# Patient Record
Sex: Male | Born: 2000 | Race: White | Hispanic: No | Marital: Single | Smoking: Never smoker
Health system: Southern US, Community
[De-identification: ages and names within clinical notes are randomized; demographics above are authoritative.]

## PROBLEM LIST (undated history)

## (undated) DIAGNOSIS — R030 Elevated blood-pressure reading, without diagnosis of hypertension: Secondary | ICD-10-CM

## (undated) HISTORY — DX: Elevated blood-pressure reading, without diagnosis of hypertension: R03.0

---
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
## 2022-06-03 DIAGNOSIS — R21 Rash and other nonspecific skin eruption: Secondary | ICD-10-CM | POA: Diagnosis not present

## 2022-06-03 DIAGNOSIS — H60331 Swimmer's ear, right ear: Secondary | ICD-10-CM | POA: Diagnosis not present

## 2022-07-30 IMAGING — DX DG CERVICAL SPINE COMPLETE 4+V
5 series · 5 of 5 positions shown · non-contrast
Comparison: None.

CLINICAL DATA: Neck pain

EXAM:
CERVICAL SPINE - COMPLETE 4+ VIEW

[c-spine lat]
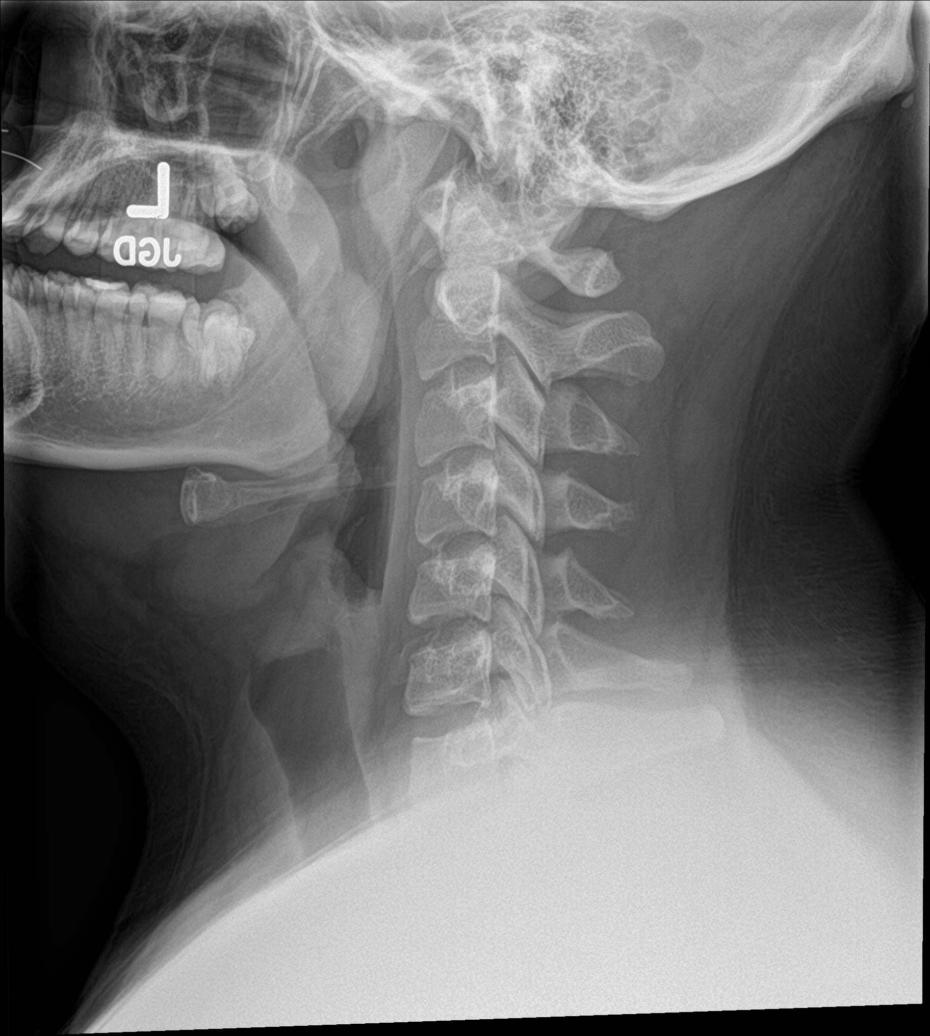

[c-spine obl (1 of 2)]
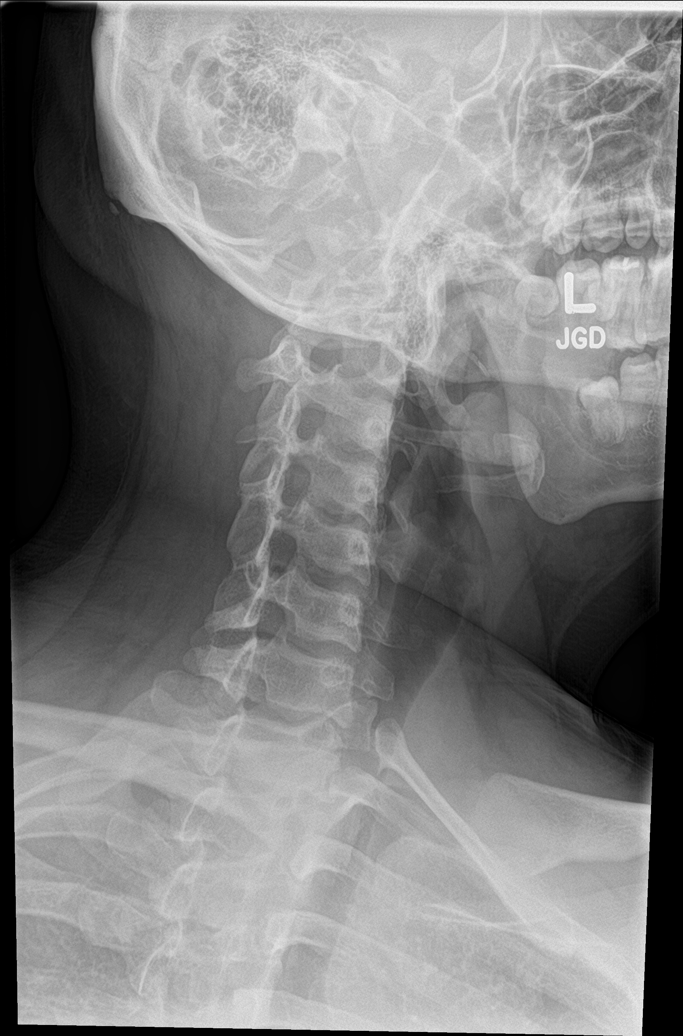

[c-spine obl (2 of 2)]
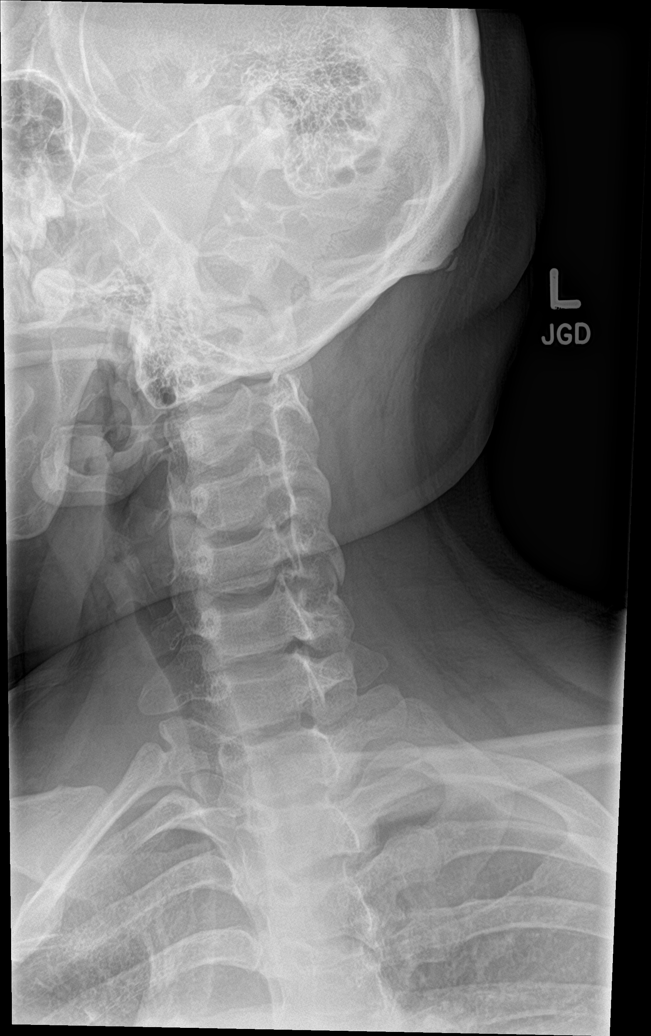

[c-spine ap]
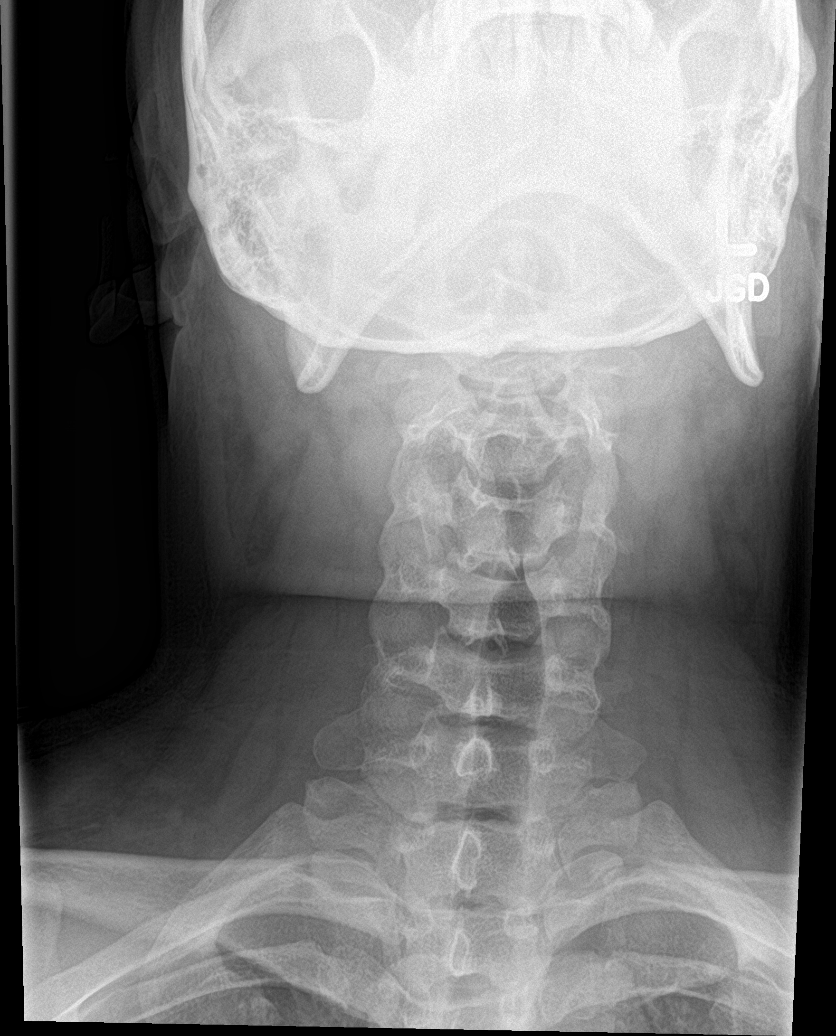

[c-spine open mouth]
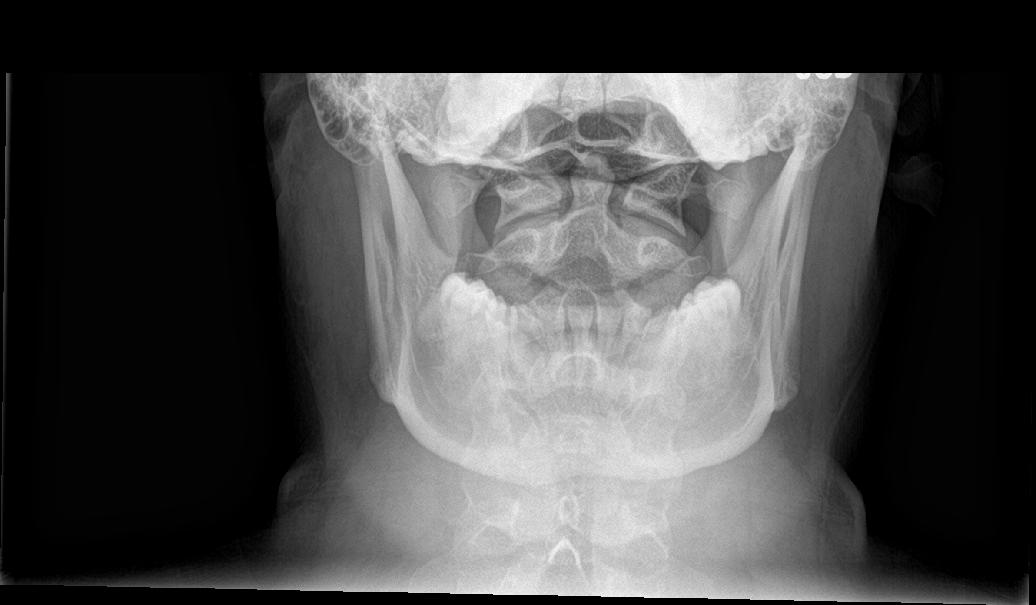

[5 of 5 positions shown; findings below may reference images not displayed]

FINDINGS: No prevertebral soft tissue swelling. Normal alignment of the
vertebral bodies. Normal spinal laminal line. Oblique projections
demonstrate no traumatic narrowing of the neural foramina. The LEFT
neural foraminal oblique view is suboptimal due to rotation. Open
mouth odontoid view demonstrates normal alignment of the lateral
masses of C1 on C2.
IMPRESSION: No explanation for neck pain. No significant arthropathy. No acute
findings

## 2022-11-20 ENCOUNTER — Other Ambulatory Visit: Payer: Self-pay

## 2022-11-20 ENCOUNTER — Emergency Department (HOSPITAL_COMMUNITY)
Admission: EM | Admit: 2022-11-20 | Discharge: 2022-11-20 | Disposition: A | Discharge status: Home / Self Care | Payer: Medicaid Other | Attending: Emergency Medicine | Admitting: Emergency Medicine

## 2022-11-20 ENCOUNTER — Encounter (HOSPITAL_COMMUNITY): Payer: Self-pay

## 2022-11-20 ENCOUNTER — Emergency Department (HOSPITAL_COMMUNITY): Payer: Medicaid Other

## 2022-11-20 DIAGNOSIS — R0602 Shortness of breath: Secondary | ICD-10-CM | POA: Insufficient documentation

## 2022-11-20 DIAGNOSIS — R0600 Dyspnea, unspecified: Secondary | ICD-10-CM | POA: Diagnosis not present

## 2022-11-20 DIAGNOSIS — R079 Chest pain, unspecified: Secondary | ICD-10-CM

## 2022-11-20 DIAGNOSIS — R0789 Other chest pain: Secondary | ICD-10-CM | POA: Diagnosis not present

## 2022-11-20 DIAGNOSIS — Z20822 Contact with and (suspected) exposure to covid-19: Secondary | ICD-10-CM | POA: Insufficient documentation

## 2022-11-20 DIAGNOSIS — R0781 Pleurodynia: Secondary | ICD-10-CM | POA: Diagnosis not present

## 2022-11-20 LAB — BASIC METABOLIC PANEL
Anion gap: 15 (ref 5–15)
BUN: 13 mg/dL (ref 6–20)
CO2: 23 mmol/L (ref 22–32)
Calcium: 9.5 mg/dL (ref 8.9–10.3)
Chloride: 102 mmol/L (ref 98–111)
Creatinine, Ser: 0.81 mg/dL (ref 0.61–1.24)
GFR, Estimated: >60 mL/min (ref >60)
Glucose, Bld: 108 mg/dL — ABNORMAL HIGH (ref 70–99)
Potassium: 3.6 mmol/L (ref 3.5–5.1)
Sodium: 140 mmol/L (ref 135–145)

## 2022-11-20 LAB — RESP PANEL BY RT-PCR (RSV, FLU A&B, COVID)  RVPGX2
Influenza A by PCR: NEGATIVE
Influenza B by PCR: NEGATIVE
Resp Syncytial Virus by PCR: NEGATIVE
SARS Coronavirus 2 by RT PCR: NEGATIVE

## 2022-11-20 LAB — CBC
HCT: 43.8 % (ref 39.0–52.0)
Hemoglobin: 15.1 g/dL (ref 13.0–17.0)
MCH: 28.6 pg (ref 26.0–34.0)
MCHC: 34.5 g/dL (ref 30.0–36.0)
MCV: 83 fL (ref 80.0–100.0)
Platelets: 236 10*3/uL (ref 150–400)
RBC: 5.28 MIL/uL (ref 4.22–5.81)
RDW: 13.7 % (ref 11.5–15.5)
WBC: 13.2 10*3/uL — ABNORMAL HIGH (ref 4.0–10.5)
nRBC: 0 % (ref 0.0–0.2)

## 2022-11-20 LAB — TROPONIN I (HIGH SENSITIVITY): Troponin I (High Sensitivity): 3 ng/L (ref <18)

## 2022-11-20 LAB — D-DIMER, QUANTITATIVE: D-Dimer, Quant: <0.27 ug{FEU}/mL (ref 0.00–0.50)

## 2022-11-20 LAB — BRAIN NATRIURETIC PEPTIDE: B Natriuretic Peptide: 6.9 pg/mL (ref 0.0–100.0)

## 2022-11-20 MED ORDER — IBUPROFEN 800 MG PO TABS
800.0000 mg | ORAL_TABLET | Freq: Once | ORAL | Status: AC
  Administered 2022-11-20: 800 mg via ORAL
  Filled 2022-11-20: qty 1

## 2022-11-20 NOTE — ED Provider Triage Note (Signed)
Emergency Medicine Provider Triage Evaluation Note  Alvin Hood , a 22 y.o. male  was evaluated in triage.  Pt complains of right sided rib pain pain and dyspnea since yesterday.  No fall or injury to the chest.  No history of asthma, COPD, lung disease.  No fever, cough, congestion.  Describes right sided rib pain as pleuritic but severe.  No history of the symptoms.  No recent surgery, long distance travel, hormone use, history of DVT/PE.  No leg pain or swelling.  Review of Systems  Positive: See HPI Negative: See HPI  Physical Exam  BP (!) 164/104   Pulse 92   Temp 98.6 F (37 C)   Resp 20   SpO2 100%  Gen:   Awake, mildly dyspneic at rest Resp:  Slight tachypnea, diminished breath sounds throughout MSK:   Moves extremities without difficulty no lower extremity edema Other:  Regular rate and rhythm, abdomen soft and nontender, no tenderness over the ribs  Medical Decision Making  Medically screening exam initiated at 7:08 PM.  Appropriate orders placed.  Jedrick Broden was informed that the remainder of the evaluation will be completed by another provider, this initial triage assessment does not replace that evaluation, and the importance of remaining in the ED until their evaluation is complete.     Richardson Dopp 11/20/22 1909

## 2022-11-20 NOTE — ED Triage Notes (Signed)
Patient arrives for eval of stabbing R sided rib cage pain and sob that started yesterday around lunchtime. SOB worse when lying flat or breathing deeply. Denies injury.

## 2022-11-20 NOTE — ED Provider Notes (Signed)
[place] EMERGENCY DEPARTMENT AT [facility] Provider Note   CSN: [id number] Arrival date & time: 11/20/22  1843     History  Chief Complaint  Patient presents with   Shortness of Breath   HPI Alvin Hood is a 22 y.o. male presenting for chest pain shortness of breath.  Started all of a sudden yesterday around lunchtime.  Chest pain is in the right side of his chest and feels like it is in the "rib cage.  Chest pain and shortness of breath started at the same time.  Shortness of breath is worse with lying flat and deep breathing.  Denies cough and fever.  Denies lower extremity edema.  States as long as he is not moving the chest pain and shortness of breath subsides.   Shortness of Breath      Home Medications Prior to Admission medications   Medication Sig Start Date End Date Taking? Authorizing Provider  EPIDUO FORTE 0.3-2.5 % GEL Thin topical layer 1-2 times a day 05/01/21   Theadore Nan, MD      Allergies    Patient has no known allergies.    Review of Systems   Review of Systems  Respiratory:  Positive for shortness of breath.     Physical Exam   Vitals:   11/20/22 2130 11/20/22 2145  BP: (!) 151/93 (!) 147/86  Pulse: 91 90  Resp: 20 16  Temp:    SpO2: 100% 95%    CONSTITUTIONAL:  well-appearing, NAD NEURO:  Alert and oriented x 3, CN 3-12 grossly intact EYES:  eyes equal and reactive ENT/NECK:  Supple, no stridor  CARDIO:  regular rate and rhythm, appears well-perfused  PULM:  No respiratory distress, CTAB, no wheezing or rales GI/GU:  non-distended, soft MSK/SPINE:  No gross deformities, no edema, moves all extremities  SKIN:  no rash, atraumatic  *Additional and/or pertinent findings included in MDM below  ED Results / Procedures / Treatments   Labs (all labs ordered are listed, but only abnormal results are displayed) Labs Reviewed  BASIC METABOLIC PANEL - Abnormal; Notable for the following components:       Result Value   Glucose, Bld 108 (*)    All other components within normal limits  CBC - Abnormal; Notable for the following components:   WBC 13.2 (*)    All other components within normal limits  RESP PANEL BY RT-PCR (RSV, FLU A&B, COVID)  RVPGX2  D-DIMER, QUANTITATIVE  BRAIN NATRIURETIC PEPTIDE  TROPONIN I (HIGH SENSITIVITY)    EKG None  Radiology DG Chest 2 View  Result Date: 11/20/2022 CLINICAL DATA:  Dyspnea and right-sided rib pain. EXAM: CHEST - 2 VIEW COMPARISON:  March 06, 2013 FINDINGS: The heart size and mediastinal contours are within normal limits. Both lungs are clear. The visualized skeletal structures are unremarkable. IMPRESSION: No active cardiopulmonary disease. Electronically Signed   By: Aram Candela M.D.   On: 11/20/2022 20:10    Procedures Procedures    Medications Ordered in ED Medications  ibuprofen (ADVIL) tablet 800 mg (has no administration in time range)    ED Course/ Medical Decision Making/ A&P                                 Medical Decision Making Amount and/or Complexity of Data Reviewed Labs: ordered.   22 yo well-appearing male presenting for shortness of breath and chest pain.  Exam was  unremarkable.  DDx includes ACS, PE, pneumothorax, rib fracture, pneumonia, precordial catch or costochondritis, and renal pathology.  Overall looks clinically well, no acute distress and hemodynamically stable.  Workup was overall reassuring.  On reassessment, patient stated that he no longer had chest pain or shortness of breath.  Doubt ACS given lack of risk factors and symptoms and reassuring EKG and troponin.  Doubt PE given PERC negative and negative D-dimer.  X-ray was unremarkable making pneumothorax, pneumonia or rib fracture unlikely.  Suspect costochondritis versus precordial catch.  Treated with ibuprofen.  Advised to follow-up with his PCP.  Vital stable.  Discussed return precautions.  Discharged.        Final Clinical  Impression(s) / ED Diagnoses Final diagnoses:  Chest pain, unspecified type    Rx / DC Orders ED Discharge Orders     None         Gareth Eagle, PA-C 11/20/22 2218    Edwin Dada P, DO 11/25/22 [phone]

## 2022-11-20 NOTE — Discharge Instructions (Addendum)
Evaluation today was overall reassuring.  Recommend you follow-up with your PCP.  If your symptoms return please return emergency department further evaluation.

## 2023-06-04 ENCOUNTER — Other Ambulatory Visit: Payer: Self-pay

## 2023-06-04 ENCOUNTER — Emergency Department (HOSPITAL_COMMUNITY)
Admission: EM | Admit: 2023-06-04 | Discharge: 2023-06-04 | Disposition: A | Discharge status: Home / Self Care | Attending: Emergency Medicine | Admitting: Emergency Medicine

## 2023-06-04 ENCOUNTER — Emergency Department (HOSPITAL_COMMUNITY)

## 2023-06-04 ENCOUNTER — Encounter (HOSPITAL_COMMUNITY): Payer: Self-pay

## 2023-06-04 DIAGNOSIS — I10 Essential (primary) hypertension: Secondary | ICD-10-CM

## 2023-06-04 DIAGNOSIS — S39012A Strain of muscle, fascia and tendon of lower back, initial encounter: Secondary | ICD-10-CM | POA: Diagnosis not present

## 2023-06-04 DIAGNOSIS — X58XXXA Exposure to other specified factors, initial encounter: Secondary | ICD-10-CM | POA: Insufficient documentation

## 2023-06-04 DIAGNOSIS — S3992XA Unspecified injury of lower back, initial encounter: Secondary | ICD-10-CM | POA: Diagnosis present

## 2023-06-04 DIAGNOSIS — M545 Low back pain, unspecified: Secondary | ICD-10-CM | POA: Diagnosis not present

## 2023-06-04 DIAGNOSIS — Z79899 Other long term (current) drug therapy: Secondary | ICD-10-CM | POA: Diagnosis not present

## 2023-06-04 MED ORDER — AMLODIPINE BESYLATE 5 MG PO TABS: 5.0000 mg | ORAL_TABLET | Freq: Every day | ORAL | 0 refills | Status: DC

## 2023-06-04 MED ORDER — IBUPROFEN 400 MG PO TABS
400.0000 mg | ORAL_TABLET | Freq: Once | ORAL | Status: AC
  Administered 2023-06-04: 400 mg via ORAL
  Filled 2023-06-04: qty 1

## 2023-06-04 MED ORDER — ACETAMINOPHEN 500 MG PO TABS
1000.0000 mg | ORAL_TABLET | Freq: Once | ORAL | Status: AC
  Administered 2023-06-04: 1000 mg via ORAL
  Filled 2023-06-04: qty 2

## 2023-06-04 NOTE — ED Notes (Signed)
 Patient transported to X-ray

## 2023-06-04 NOTE — Progress Notes (Signed)
 Transition of Care [facility]) - Inpatient Brief Assessment   Patient Details  Name: Alvin Hood MRN: [id number] Date of Birth: 2000-08-08  Transition of Care [facility] CM/SW Contact:    Alvin Cohn, RN Phone Number: 06/04/2023, 12:23 PM   Clinical Narrative: RNCM consulted regarding PCP establishment. Pt presented to [facility] ED today with back pain.  RNCM obtained appointment on (5/6), time (1030) with Caud[place] and placed on After Visit Summary paperwork.  No further case management needs communicated at this time. Alvin Moldovan J. Lucretia Roers, RN, BSN, [place] [phone]    Transition of Care Asessment: Insurance and Status: (P) Insurance coverage has been reviewed Patient has primary care physician: (P) No Home environment has been reviewed: (P) fom home Prior level of function:: (P) independent Prior/Current Home Services: (P) No current home services Social Drivers of Health Review: (P) SDOH reviewed no interventions necessary Readmission risk has been reviewed: (P) Yes Transition of care needs: (P) transition of care needs identified, TOC will continue to follow

## 2023-06-04 NOTE — ED Provider Notes (Addendum)
 [place] EMERGENCY DEPARTMENT AT [facility] Provider Note   CSN: [id number] Arrival date & time: 06/04/23  2130     History  Chief Complaint  Patient presents with   Back Pain    Alvin Hood is a 23 y.o. male.  Patient is a 23 year old male who is presenting today with complaints of back pain.  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  Since that time he reports he has had pain in the middle of his lower back that has been fairly persistent but waxes and wanes in severity.  He reports today he was at work and was having hard time even bending over to put on his shoes.  The pain does not radiate down his legs or cause any numbness or tingling his legs but he does note that it is painful even when he walks.  He occasionally will try Advil but states it does not really help the pain.  He has not had any evaluation of this pain as he was hoping it would just go away.  It does not radiate around into his abdomen it does not affect urinary or bladder habits.  When he lifts his legs it does reproduce the pain in his back.  No IV drug use, tobacco use or alcohol use.  The history is provided by the patient.  Back Pain      Home Medications Prior to Admission medications   Medication Sig Start Date End Date Taking? Authorizing Provider  amLODipine (NORVASC) 5 MG tablet Take 1 tablet (5 mg total) by mouth daily. 06/04/23 07/04/23 Yes Gwyneth Sprout, MD  EPIDUO FORTE 0.3-2.5 % GEL Thin topical layer 1-2 times a day 05/01/21   Theadore Nan, MD      Allergies    Patient has no known allergies.    Review of Systems   Review of Systems  Musculoskeletal:  Positive for back pain.    Physical Exam Updated Vital Signs BP (!) 165/108   Pulse 79   Temp 98 F (36.7 C)   Resp 19   Ht 6\' 3"  (1.905 m)   Wt (!) 158.8 kg   SpO2 100%   BMI 43.75 kg/m  Physical  Exam Vitals and nursing note reviewed.  Constitutional:      General: He is not in acute distress.    Appearance: He is well-developed.  HENT:     Head: Normocephalic and atraumatic.  Eyes:     Conjunctiva/sclera: Conjunctivae normal.     Pupils: Pupils are equal, round, and reactive to light.  Cardiovascular:     Rate and Rhythm: Normal rate and regular rhythm.     Heart sounds: No murmur heard. Pulmonary:     Effort: Pulmonary effort is normal. No respiratory distress.     Breath sounds: Normal breath sounds. No wheezing or rales.  Abdominal:     General: There is no distension.     Palpations: Abdomen is soft.     Tenderness: There is no abdominal tenderness. There is no guarding or rebound.  Musculoskeletal:        General: Tenderness present. Normal range of motion.     Cervical back: Normal, normal range of motion and neck supple.     Thoracic back: Normal.       Back:  Skin:    General: Skin is warm and dry.  Findings: No erythema or rash.  Neurological:     Mental Status: He is alert and oriented to person, place, and time.     Sensory: No sensory deficit.     Motor: No weakness.     Gait: Gait normal.     Comments: Lifting the legs bilaterally causes pain in the back but no radicular symptoms  Psychiatric:        Behavior: Behavior normal.     ED Results / Procedures / Treatments   Labs (all labs ordered are listed, but only abnormal results are displayed) Labs Reviewed - No data to display  EKG None  Radiology DG Lumbar Spine Complete Result Date: 06/04/2023 CLINICAL DATA:  Low back pain. EXAM: LUMBAR SPINE - COMPLETE 4+ VIEW COMPARISON:  None Available. FINDINGS: There are 5 nonrib-bearing lumbar vertebrae. There is loss of lumbar lordosis, which may be on the basis of positioning or due to muscle spasm. No spondylolysis or spondylolisthesis. Vertebral body heights are maintained. No aggressive osseous lesion. Intervertebral disc heights are maintained.  No significant facet arthropathy or marginal osteophyte formation. Sacroiliac joints are symmetric. Visualized soft tissues are within normal limits. IMPRESSION: *No acute osseous abnormality of the lumbar spine. Electronically Signed   By: Jules Schick M.D.   On: 06/04/2023 11:52    Procedures Procedures    Medications Ordered in ED Medications  ibuprofen (ADVIL) tablet 400 mg (400 mg Oral Given 06/04/23 0908)  acetaminophen (TYLENOL) tablet 1,000 mg (1,000 mg Oral Given 06/04/23 0908)    ED Course/ Medical Decision Making/ A&P                                 Medical Decision Making Amount and/or Complexity of Data Reviewed Radiology: ordered and independent interpretation performed. Decision-making details documented in ED Course.  Risk OTC drugs. Prescription drug management.   Patient here complaining of midline back tenderness.  Concern for possible stress compression fracture given what caused it from the beginning with continued wear and tear versus muscular in nature.  No radicular symptoms to suggest disc disease.  Patient is neurologically intact and has no findings concerning for cauda equina.  Low suspicion for an acute infectious cause such as discitis, epidural abscess as patient is not an IV drug user has had no interventions and denies any infectious symptoms.  Will get plain imaging to ensure normal lumbar spine.  Also patient is noted to be hypertensive here and looking back over the last few years all of his office visits and ER visits show that he has been hypertensive at least greater than 140/80.  11:57 AM I have independently visualized and interpreted pt's images today.  No obvious abnormalities on lumbar images.  Patient remains hypertensive here.  Did discuss this with him.  He would like to try diet changes and exercise prior to starting medications.  He does not have a PCP but was given ambulatory referral.  Also given information for follow-up and possible  physical therapy.         Final Clinical Impression(s) / ED Diagnoses Final diagnoses:  Strain of lumbar region, initial encounter  Uncontrolled hypertension    Rx / DC Orders ED Discharge Orders          Ordered    amLODipine (NORVASC) 5 MG tablet  Daily        06/04/23 1142    Referral to [facility] Care Management  Comments: Pharmacy Medication Management for Uncontrolled hypertension in the ED   06/04/23 1144              Gwyneth Sprout, MD 06/04/23 1143    Gwyneth Sprout, MD 06/04/23 1157

## 2023-06-04 NOTE — Discharge Instructions (Addendum)

## 2023-06-04 NOTE — ED Triage Notes (Addendum)
 Pt. Stated, Alvin Hood had back pain since December and this morning I was at work and couldn't even bend over. Nothing triggers it. Only when I bend its there the rest of the day.Marland Kitchen No problem with going to bathroom or holding.any urine or boop

## 2023-06-06 ENCOUNTER — Telehealth: Payer: Self-pay | Admitting: *Deleted

## 2023-06-06 NOTE — Progress Notes (Signed)
 Care Guide Pharmacy Note  06/06/2023 Name: Alvin Hood MRN: [id number] DOB: May 04, 2000  Referred By: Patient, No Pcp Per Reason for referral: Care Coordination (Outreach to schedule referral with pharmacist )   Alvin Hood is a 22 y.o. year old male who is a primary care patient of Patient, No Pcp Per.  Ashrith Sagan was referred to the pharmacist for assistance related to: HTN  Successful contact was made with the patient to discuss pharmacy services including being ready for the pharmacist to call at least 5 minutes before the scheduled appointment time and to have medication bottles and any blood pressure readings ready for review. The patient agreed to meet with the pharmacist via telephone visit on 06/14/2023  Burman Nieves, CMA [place]  [facility], [facility] Guide Direct Dial: [phone]  Fax: [phone] Website: [email or web address]

## 2023-06-14 ENCOUNTER — Other Ambulatory Visit: Payer: Self-pay

## 2023-06-14 NOTE — Progress Notes (Signed)
   06/14/2023  Patient ID: Alvin Hood, male   DOB: 02-13-2001, 23 y.o.   MRN: [id number]  Contacted patient via telephone to follow up on ED referral for HTN.  Confirmed patient has Amlodipine 5mg  daily and is taking.  Patient has a BP monitor at home but has not been checking. Will begin checking daily and keep a log to bring with him to new PCP appointment.  Will run out of medication before PCP visit, will coordinate with PCP office to get refills to avoid medication gap.  Counseled to limit sodium/caffeine intake. Counseled on importance of primary care routine follow up.  Counseled on return ED precautions.  Sherrill Raring, PharmD Clinical Pharmacist [phone]

## 2023-06-17 ENCOUNTER — Other Ambulatory Visit (HOSPITAL_BASED_OUTPATIENT_CLINIC_OR_DEPARTMENT_OTHER): Payer: Self-pay | Admitting: *Deleted

## 2023-06-17 MED ORDER — AMLODIPINE BESYLATE 5 MG PO TABS: 5.0000 mg | ORAL_TABLET | Freq: Every day | ORAL | 0 refills | Status: DC

## 2023-07-16 ENCOUNTER — Encounter (HOSPITAL_BASED_OUTPATIENT_CLINIC_OR_DEPARTMENT_OTHER): Payer: Self-pay | Admitting: Family Medicine

## 2023-07-16 ENCOUNTER — Ambulatory Visit (HOSPITAL_BASED_OUTPATIENT_CLINIC_OR_DEPARTMENT_OTHER): Admitting: Family Medicine

## 2023-07-16 VITALS — BP 140/100 | HR 82 | Ht 75.0 in | Wt 349.2 lb

## 2023-07-16 DIAGNOSIS — S39012A Strain of muscle, fascia and tendon of lower back, initial encounter: Secondary | ICD-10-CM | POA: Diagnosis not present

## 2023-07-16 DIAGNOSIS — I10 Essential (primary) hypertension: Secondary | ICD-10-CM | POA: Diagnosis not present

## 2023-07-16 MED ORDER — LOSARTAN POTASSIUM 25 MG PO TABS
25.0000 mg | ORAL_TABLET | Freq: Every day | ORAL | 2 refills | Status: DC
Start: 2023-07-16 — End: 2023-12-13

## 2023-07-16 NOTE — Patient Instructions (Signed)
 Monitor and check your Blood pressure at least twice weekly and keep a log. Goal is less than 130/80.   Monitor salt intake.

## 2023-07-16 NOTE — Progress Notes (Signed)
 New Patient Office Visit  Subjective:   Alvin Hood November 07, 2000 07/16/2023  Chief Complaint  Patient presents with   New Patient (Initial Visit)    Patient is here today to get established with the practice. Has concerns of lower back pain and BP that he wants to discuss.    HPI: Alvin Hood presents today to establish care at [facility] Medicine at [facility]. Introduced to Publishing rights manager role and practice setting.  All questions answered.    HYPERTENSION: [name] presents for the medical management of hypertension. Reports family hx of HTN. HE was diagnosed on 06/04/23 in ER for visit of back pain.  Patient's current hypertension medication regimen is: Amlodipine  5mg   Patient is  currently taking prescribed medications for HTN.  Patient is not regularly keeping a check on BP at home.  Adhering to low sodium diet: Yes. He has cut back on soda, sweets and monitoring intake.  Exercising Regularly: Yes Denies headache, dizziness, CP, SHOB, vision changes.   BP Readings from Last 3 Encounters:  07/16/23 (!) 140/100  06/04/23 (!) 157/97  11/20/22 (!) 149/90    BACK PAIN:   Alvin Hood presents with complaint of back pain. Patient was seen in ER on 06/04/2023 for lumbar pain. He states he rested for approx. 2 weeks and had improvement. He states pain is present intermittently. No shooting or radiating pain. He does perform heavy lifting frequently with proper body mechanics. He is a Psychologist, occupational and is frequently bent over working on projects.   ASSOCIATED SYMPTOMS:  Chills: no Dysuria: no Abdominal Pain: no Nausea: no Vomiting: no Hematuria: no Dizziness: no Bowel or Bladder Dysfunction: no Numbness: no Weakness: no  The following portions of the patient's history were reviewed and updated as appropriate: past medical history, past surgical history, family history, social history,  allergies, medications, and problem list.   Patient Active Problem List   Diagnosis Date Noted   Elevated blood pressure reading 04/21/2019   Hypovitaminosis D 04/21/2019   Abnormal liver enzymes 01/13/2019   Myopia-wears glasses 01/13/2019   Obesity, pediatric, BMI greater than or equal to 95th percentile for age 21/03/2016   Past Medical History:  Diagnosis Date   Blood pressure elevated without history of HTN 10/10/2016   History reviewed. No pertinent surgical history. Family History  Problem Relation Age of Onset   Schizophrenia Maternal Aunt    Diabetes Mother    Social History   Socioeconomic History   Marital status: Single    Spouse name: Not on file   Number of children: Not on file   Years of education: Not on file   Highest education level: Not on file  Occupational History   Not on file  Tobacco Use   Smoking status: Never   Smokeless tobacco: Never  Substance and Sexual Activity   Alcohol use: Yes    Comment: very rarely   Drug use: Never   Sexual activity: Not on file  Other Topics Concern   Not on file  Social History Narrative   Not on file   Social Drivers of Health   Financial Resource Strain: Not on file  Food Insecurity: Not on file  Transportation Needs: Not on file  Physical Activity: Not on file  Stress: Not on file  Social Connections: Not on file  Intimate Partner Violence: Not on file   Outpatient Medications Prior to Visit  Medication Sig Dispense Refill   amLODipine  (NORVASC ) 5  MG tablet Take 1 tablet (5 mg total) by mouth daily. 30 tablet 0   EPIDUO  FORTE 0.3-2.5 % GEL Thin topical layer 1-2 times a day 60 g 5   No facility-administered medications prior to visit.   No Known Allergies  ROS: A complete ROS was performed with pertinent positives/negatives noted in the HPI. The remainder of the ROS are negative.   Objective:   Today's Vitals   07/16/23 1011 07/16/23 1042  BP: (!) 158/108 (!) 140/100  Pulse: 82   SpO2: 98%    Weight: (!) 349 lb 3.2 oz (158.4 kg)   Height: 6\' 3"  (1.905 m)     GENERAL: Well-appearing, in NAD. Well nourished.  SKIN: Pink, warm and dry.  Head: Normocephalic. NECK: Trachea midline. Full ROM w/o pain or tenderness.  RESPIRATORY: Chest wall symmetrical. Respirations even and non-labored. Breath sounds clear to auscultation bilaterally.  CARDIAC: S1, S2 present, regular rate and rhythm without murmur or gallops. Peripheral pulses 2+ bilaterally.  MSK: Muscle tone and strength appropriate for age. No step off, deformity or crepitus to spine. Mild paraspinal tightness with palpation. Negative straight leg raise test bilaterally.  No point tenderness.  NEUROLOGIC: No motor or sensory deficits. Steady, even gait. C2-C12 intact.  PSYCH/MENTAL STATUS: Alert, oriented x 3. Cooperative, appropriate mood and affect.       Assessment & Plan:  1. Primary hypertension (Primary) Uncontrolled. Continue Amlodipine  5mg  and start Losartan 25mg  daily. Monitor BP regularly and keep a log. Return for BP check in 4-6 weeks. Will obtain fasting labs today.  - losartan (COZAAR) 25 MG tablet; Take 1 tablet (25 mg total) by mouth daily.  Dispense: 60 tablet; Refill: 2 - CBC with Differential/Platelet - Comprehensive metabolic panel with GFR  2. Morbid obesity (HCC) Discussed needed dietary changes and exercise to reduce comorbidities and risk. Will obtain Lipid, A1C and TSH given morbid obesity.  - Lipid panel - TSH - Hemoglobin A1c  3. Strain of lumbar region, initial encounter Discussed importance of proper body mechanics and strengthening of lumbar spine. No signs of herniated disc, fracture or sciatica as pain is intermittent with lifting. Recommend strengthening exercises , ice and heat, NSAID as needed.   Patient to reach out to office if new, worrisome, or unresolved symptoms arise or if no improvement in patient's condition. Patient verbalized understanding and is agreeable to treatment plan.  All questions answered to patient's satisfaction.    Return in about 4 weeks (around 08/13/2023) for ANNUAL PHYSICAL, HYPERTENSION.    Nonda Bays, [place]

## 2023-07-17 LAB — LIPID PANEL
Chol/HDL Ratio: 4.9 ratio (ref 0.0–5.0)
Cholesterol, Total: 182 mg/dL (ref 100–199)
HDL: 37 mg/dL — ABNORMAL LOW (ref >39)
LDL Chol Calc (NIH): 123 mg/dL — ABNORMAL HIGH (ref 0–99)
Triglycerides: 121 mg/dL (ref 0–149)
VLDL Cholesterol Cal: 22 mg/dL (ref 5–40)

## 2023-07-17 LAB — COMPREHENSIVE METABOLIC PANEL WITH GFR
ALT: 183 IU/L — ABNORMAL HIGH (ref 0–44)
AST: 95 IU/L — ABNORMAL HIGH (ref 0–40)
Albumin: 4.7 g/dL (ref 4.3–5.2)
Alkaline Phosphatase: 133 IU/L — ABNORMAL HIGH (ref 44–121)
BUN/Creatinine Ratio: 10 (ref 9–20)
BUN: 7 mg/dL (ref 6–20)
Bilirubin Total: 0.8 mg/dL (ref 0.0–1.2)
CO2: 24 mmol/L (ref 20–29)
Calcium: 9.4 mg/dL (ref 8.7–10.2)
Chloride: 102 mmol/L (ref 96–106)
Creatinine, Ser: 0.73 mg/dL — ABNORMAL LOW (ref 0.76–1.27)
Globulin, Total: 3.1 g/dL (ref 1.5–4.5)
Glucose: 115 mg/dL — ABNORMAL HIGH (ref 70–99)
Potassium: 4.2 mmol/L (ref 3.5–5.2)
Sodium: 141 mmol/L (ref 134–144)
Total Protein: 7.8 g/dL (ref 6.0–8.5)
eGFR: 133 mL/min/{1.73_m2} (ref >59)

## 2023-07-17 LAB — CBC WITH DIFFERENTIAL/PLATELET
Basophils Absolute: 0.1 10*3/uL (ref 0.0–0.2)
Basos: 1 %
EOS (ABSOLUTE): 0.1 10*3/uL (ref 0.0–0.4)
Eos: 1 %
Hematocrit: 47 % (ref 37.5–51.0)
Hemoglobin: 15.6 g/dL (ref 13.0–17.7)
Immature Grans (Abs): 0 10*3/uL (ref 0.0–0.1)
Immature Granulocytes: 0 %
Lymphocytes Absolute: 2.1 10*3/uL (ref 0.7–3.1)
Lymphs: 23 %
MCH: 27.9 pg (ref 26.6–33.0)
MCHC: 33.2 g/dL (ref 31.5–35.7)
MCV: 84 fL (ref 79–97)
Monocytes Absolute: 0.5 10*3/uL (ref 0.1–0.9)
Monocytes: 6 %
Neutrophils Absolute: 6.2 10*3/uL (ref 1.4–7.0)
Neutrophils: 69 %
Platelets: 268 10*3/uL (ref 150–450)
RBC: 5.6 x10E6/uL (ref 4.14–5.80)
RDW: 13.8 % (ref 11.6–15.4)
WBC: 8.9 10*3/uL (ref 3.4–10.8)

## 2023-07-17 LAB — HEMOGLOBIN A1C
Est. average glucose Bld gHb Est-mCnc: 117 mg/dL
Hgb A1c MFr Bld: 5.7 % — ABNORMAL HIGH (ref 4.8–5.6)

## 2023-07-17 LAB — TSH: TSH: 1.74 u[IU]/mL (ref 0.450–4.500)

## 2023-07-23 ENCOUNTER — Ambulatory Visit (HOSPITAL_BASED_OUTPATIENT_CLINIC_OR_DEPARTMENT_OTHER): Payer: Self-pay | Admitting: Family Medicine

## 2023-07-23 NOTE — Progress Notes (Signed)
 Your liver enzymes are chronically elevated.  Have you ever had an ultrasound to determine if this is due to fatty liver disease?  Dietary changes such as reducing alcohol intake, reducing fatty foods or fried foods, reducing sugar and exercising regularly will help.  Your A1c does place you in the prediabetic range.  Due to this, I would recommend significant dietary changes, regular exercise and possibly starting metformin to help lower your risk of progressing to type 2 diabetes.  If you are interested in starting the medication and/or obtaining an ultrasound to monitor your liver enzymes, please reply and let me know.  Please schedule your follow-up as directed.

## 2023-08-07 ENCOUNTER — Other Ambulatory Visit (HOSPITAL_BASED_OUTPATIENT_CLINIC_OR_DEPARTMENT_OTHER): Payer: Self-pay | Admitting: Family Medicine

## 2023-08-07 DIAGNOSIS — R748 Abnormal levels of other serum enzymes: Secondary | ICD-10-CM

## 2023-08-07 DIAGNOSIS — R7303 Prediabetes: Secondary | ICD-10-CM

## 2023-08-07 MED ORDER — METFORMIN HCL ER 500 MG PO TB24
500.0000 mg | ORAL_TABLET | Freq: Every day | ORAL | 3 refills | Status: AC
Start: 2023-08-07 — End: ?

## 2023-08-23 ENCOUNTER — Ambulatory Visit (HOSPITAL_BASED_OUTPATIENT_CLINIC_OR_DEPARTMENT_OTHER): Payer: Self-pay | Admitting: Family Medicine

## 2023-08-23 ENCOUNTER — Ambulatory Visit (HOSPITAL_BASED_OUTPATIENT_CLINIC_OR_DEPARTMENT_OTHER)
Admission: RE | Admit: 2023-08-23 | Discharge: 2023-08-23 | Disposition: A | Discharge status: Home / Self Care | Source: Ambulatory Visit | Attending: Family Medicine | Admitting: Family Medicine

## 2023-08-23 DIAGNOSIS — K760 Fatty (change of) liver, not elsewhere classified: Secondary | ICD-10-CM | POA: Insufficient documentation

## 2023-08-23 DIAGNOSIS — R748 Abnormal levels of other serum enzymes: Secondary | ICD-10-CM | POA: Diagnosis not present

## 2023-08-23 DIAGNOSIS — K7689 Other specified diseases of liver: Secondary | ICD-10-CM | POA: Diagnosis not present

## 2023-08-23 NOTE — Progress Notes (Signed)
 Hi Alvin Hood, Your liver ultrasound does show evidence of fatty liver disease. The best way to improve this and prevent worsening of liver disease is to avoid greasy/fatty foods. Adhere to a diet with lean proteins, vegetables, fruits, and low carbohydrates. Drink plenty of water. Regular exercise. Medication such as Frederik Jansky is to be approved for non-alcoholic fatty liver disease and would be good to help with weight loss to improve fatty liver disease. We can discuss this further if you are interested in starting.

## 2023-10-28 ENCOUNTER — Other Ambulatory Visit (HOSPITAL_BASED_OUTPATIENT_CLINIC_OR_DEPARTMENT_OTHER): Payer: Self-pay | Admitting: *Deleted

## 2023-10-28 MED ORDER — AMLODIPINE BESYLATE 5 MG PO TABS: 5.0000 mg | ORAL_TABLET | Freq: Every day | ORAL | 0 refills | Status: DC

## 2023-12-02 ENCOUNTER — Other Ambulatory Visit (HOSPITAL_BASED_OUTPATIENT_CLINIC_OR_DEPARTMENT_OTHER): Payer: Self-pay | Admitting: Family Medicine

## 2023-12-02 NOTE — Telephone Encounter (Signed)
 Please call patient to have him schedule follow up due to HTN. Refill will be sent in.

## 2023-12-02 NOTE — Telephone Encounter (Signed)
 Routing to Jenny and Kiana for help with trying to get pt scheduled for an OV.

## 2023-12-13 ENCOUNTER — Ambulatory Visit (HOSPITAL_BASED_OUTPATIENT_CLINIC_OR_DEPARTMENT_OTHER): Admitting: Family Medicine

## 2023-12-13 ENCOUNTER — Encounter (HOSPITAL_BASED_OUTPATIENT_CLINIC_OR_DEPARTMENT_OTHER): Payer: Self-pay | Admitting: Family Medicine

## 2023-12-13 VITALS — BP 148/98 | HR 82 | Ht 75.0 in | Wt 361.0 lb

## 2023-12-13 DIAGNOSIS — K760 Fatty (change of) liver, not elsewhere classified: Secondary | ICD-10-CM | POA: Diagnosis not present

## 2023-12-13 DIAGNOSIS — Z23 Encounter for immunization: Secondary | ICD-10-CM

## 2023-12-13 DIAGNOSIS — I10 Essential (primary) hypertension: Secondary | ICD-10-CM

## 2023-12-13 DIAGNOSIS — R7303 Prediabetes: Secondary | ICD-10-CM | POA: Diagnosis not present

## 2023-12-13 LAB — COMPREHENSIVE METABOLIC PANEL WITH GFR
ALT: 351 IU/L — ABNORMAL HIGH (ref 0–44)
AST: 193 IU/L — ABNORMAL HIGH (ref 0–40)
Albumin: 4.6 g/dL (ref 4.3–5.2)
Alkaline Phosphatase: 139 IU/L — ABNORMAL HIGH (ref 47–123)
BUN/Creatinine Ratio: 12 (ref 9–20)
BUN: 8 mg/dL (ref 6–20)
Bilirubin Total: 0.9 mg/dL (ref 0.0–1.2)
CO2: 21 mmol/L (ref 20–29)
Calcium: 9.7 mg/dL (ref 8.7–10.2)
Chloride: 101 mmol/L (ref 96–106)
Creatinine, Ser: 0.66 mg/dL — ABNORMAL LOW (ref 0.76–1.27)
Globulin, Total: 3.1 g/dL (ref 1.5–4.5)
Glucose: 113 mg/dL — ABNORMAL HIGH (ref 70–99)
Potassium: 3.8 mmol/L (ref 3.5–5.2)
Sodium: 140 mmol/L (ref 134–144)
Total Protein: 7.7 g/dL (ref 6.0–8.5)
eGFR: 137 mL/min/1.73 (ref >59)

## 2023-12-13 LAB — HEMOGLOBIN A1C
Est. average glucose Bld gHb Est-mCnc: 117 mg/dL
Hgb A1c MFr Bld: 5.7 % — ABNORMAL HIGH (ref 4.8–5.6)

## 2023-12-13 MED ORDER — AMLODIPINE-OLMESARTAN 5-20 MG PO TABS: 1.0000 | ORAL_TABLET | Freq: Every day | ORAL | 2 refills | Status: DC

## 2023-12-13 NOTE — Patient Instructions (Signed)
 STOP AMLODIPINE   START AZOR (AMLODIPINE -OLMESARTAN 5-20mg )

## 2023-12-13 NOTE — Addendum Note (Signed)
 Addended by: Kinsley Nicklaus on: 12/13/2023 10:28 AM   Modules accepted: Orders

## 2023-12-13 NOTE — Progress Notes (Signed)
 Subjective:   Alvin Hood 17-Oct-2000 12/13/2023  Chief Complaint  Patient presents with   Medical Management of Chronic Issues    Pt is here today to discuss medications. Also states he feels like he has something crawling in his ear.    Discussed the use of AI scribe software for clinical note transcription with the patient, who gave verbal consent to proceed.  History of Present Illness Carr Alvin Hood is a 23 year old male with hypertension who presents with concerns about blood pressure management and medication questions.   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 He takes amlodipine  5 mg daily but has not been taking losartan  as he was unaware of this prescription. He has experienced headaches recently, which he attributes to dehydration, and acknowledges inconsistent monitoring of his salt intake. He has been eating less but has not been exercising regularly due to the demands of a new job. Patient was recommended to return in 4 weeks following initiation of Losartan  and Amlodipine , but did not follow up.    BP Readings from Last 3 Encounters:  12/13/23 (!) 154/99  07/16/23 (!) 140/100  06/04/23 (!) 157/97    PREDIABETES:  He is currently taking metformin  once daily, which he refilled at the end of August. He has not made significant dietary changes. He is increasing his water intake and exercising.   Lab Results  Component Value Date   HGBA1C 5.7 (H) 07/16/2023     FATTY LIVER DISEASE:  In May, his liver enzymes were elevated, and he underwent an ultrasound for further evaluation which confirmed fatty liver disease. He mentions the need for dietary changes and increased physical activity to manage this condition.  Lab Results  Component Value Date   ALT 183 (H) 07/16/2023   AST 95 (H) 07/16/2023   ALKPHOS 133 (H)  07/16/2023   BILITOT 0.8 07/16/2023    Wt Readings from Last 3 Encounters:  12/13/23 (!) 361 lb (163.7 kg)  07/16/23 (!) 349 lb 3.2 oz (158.4 kg)  06/04/23 (!) 350 lb (158.8 kg)     The following portions of the patient's history were reviewed and updated as appropriate: past medical history, past surgical history, family history, social history, allergies, medications, and problem list.   Patient Active Problem List   Diagnosis Date Noted   Prediabetes 12/13/2023   Nonalcoholic fatty liver disease 08/23/2023   Primary hypertension 07/16/2023   Morbid obesity (HCC) 07/16/2023   Elevated blood pressure reading 04/21/2019   Hypovitaminosis D 04/21/2019   Abnormal liver enzymes 01/13/2019   Myopia-wears glasses 01/13/2019   Past Medical History:  Diagnosis Date   Blood pressure elevated without history of HTN 10/10/2016   History reviewed. No pertinent surgical history. Family History  Problem Relation Age of Onset   Schizophrenia Maternal Aunt    Diabetes Mother    Outpatient Medications Prior to Visit  Medication Sig Dispense Refill   metFORMIN  (GLUCOPHAGE -XR) 500 MG 24 hr tablet Take 1 tablet (500 mg total) by mouth daily with breakfast. 90 tablet 3   amLODipine  (NORVASC ) 5 MG tablet TAKE 1 TABLET(5 MG) BY MOUTH DAILY 90 tablet 0   losartan  (COZAAR ) 25 MG tablet Take 1 tablet (25 mg total) by mouth daily. 60 tablet 2   No facility-administered medications prior to visit.   No Known Allergies   ROS: A complete ROS  was performed with pertinent positives/negatives noted in the HPI. The remainder of the ROS are negative.    Objective:   Today's Vitals   12/13/23 0919 12/13/23 0947  BP: (!) 154/99 (!) 148/98  Pulse: 82   SpO2: 98%   Weight: (!) 361 lb (163.7 kg)   Height: 6' 3 (1.905 m)     Physical Exam   GENERAL: Well-appearing, in NAD. Obese.  SKIN: Pink, warm and dry.  Head: Normocephalic. NECK: Trachea midline. Full ROM w/o pain or tenderness.  EARS:  Tympanic membranes are intact, translucent without bulging and without drainage. Appropriate landmarks visualized.  EYES: Conjunctiva clear without exudates. EOMI, PERRL, no drainage present.  RESPIRATORY: Chest wall symmetrical. Respirations even and non-labored.  CARDIAC: S1, S2 present, regular rate and rhythm without murmur or gallops. Peripheral pulses 2+ bilaterally.  MSK: Muscle tone and strength appropriate for age. NEUROLOGIC: No motor or sensory deficits. Steady, even gait. C2-C12 intact.  PSYCH/MENTAL STATUS: Alert, oriented x 3. Cooperative, appropriate mood and affect.   Health Maintenance Due  Topic Date Due   Meningococcal B Vaccine (1 of 2 - Standard) Never done   Hepatitis C Screening  Never done   DTaP/Tdap/Td (8 - Td or Tdap) 10/20/2023   COVID-19 Vaccine (4 - 2025-26 season) 11/11/2023       Assessment & Plan:  1. Nonalcoholic fatty liver disease (Primary) Discussed needed dietary changes and exercise. Will check LFTs with labs today.  - Comprehensive metabolic panel with GFR  2. Primary hypertension Uncontrolled. Patient unaware to continue Losartan  and has only been on Amlodpine 5mg . Will start Azor (amlodipine -olmesartan5-20mg ) daily for improved adherence. Will return in 2 weeks for BP check and BMP. Discussed monitoring BP with home cuff as well and increasing clear fluid intake.  - Comprehensive metabolic panel with GFR  3. Prediabetes Will check A1C with labs today. Discussed dietary changes, exercise, and continuation of Metformin .  - Hemoglobin A1c  4. Morbid obesity (HCC) Patient would likely benefit from GLP 1 and Medical weight management. Pending A1C, may offer referral suggestion.   5. Immunization due Recommend updating Tdap. He believes this may have been updated in 2023 and will check records.    Meds ordered this encounter  Medications   amLODipine -olmesartan (AZOR) 5-20 MG tablet    Sig: Take 1 tablet by mouth daily.    Dispense:  30  tablet    Refill:  2    Supervising Provider:   DE PERU, RAYMOND J [[id number]]   Lab Orders         Comprehensive metabolic panel with GFR         Hemoglobin A1c      Return for 2 weeks BP check & lab; 3 months AE, HTN, FLD, IFG f/u.    Patient to reach out to office if new, worrisome, or unresolved symptoms arise or if no improvement in patient's condition. Patient verbalized understanding and is agreeable to treatment plan. All questions answered to patient's satisfaction.    Thersia Schuyler Stark, [place]

## 2023-12-17 ENCOUNTER — Ambulatory Visit (HOSPITAL_BASED_OUTPATIENT_CLINIC_OR_DEPARTMENT_OTHER): Payer: Self-pay | Admitting: Family Medicine

## 2023-12-17 DIAGNOSIS — K760 Fatty (change of) liver, not elsewhere classified: Secondary | ICD-10-CM

## 2023-12-17 NOTE — Progress Notes (Signed)
 Please call patient (has not accessed mychart since 2021) regarding his liver enzymes. His liver enzymes have more than doubled since last check and his creatinine has slightly decreased. At this time, I recommend starting GLP 1 therapy for the treatment of uncontrolled fatty liver disease that is increasing his risk for cirrhosis. He also needs to increase water intake to support his kidney function. His A1C has not improved and is in the prediabetic range.He is needing to follow up in 2 weeks with starting BP medication and needed BMP recheck. If agreeable to GLP 1, I will send in/can discuss further.

## 2023-12-20 ENCOUNTER — Encounter (HOSPITAL_BASED_OUTPATIENT_CLINIC_OR_DEPARTMENT_OTHER): Payer: Self-pay | Admitting: Family Medicine

## 2023-12-20 NOTE — Progress Notes (Signed)
 Options with patient over the phone and he would like to start with diet and exercise.  He has made significant dietary changes.  We discussed caloric intake and calorie tracking in depth as well as decreasing fatty intake, alcohol and sugars.  Recommend recheck in 8 to 10 weeks.  Please schedule patient for lab appointment

## 2024-01-03 ENCOUNTER — Ambulatory Visit (INDEPENDENT_AMBULATORY_CARE_PROVIDER_SITE_OTHER): Admitting: *Deleted

## 2024-01-03 DIAGNOSIS — I10 Essential (primary) hypertension: Secondary | ICD-10-CM

## 2024-01-03 LAB — BASIC METABOLIC PANEL WITH GFR
BUN/Creatinine Ratio: 11 (ref 9–20)
BUN: 7 mg/dL (ref 6–20)
CO2: 21 mmol/L (ref 20–29)
Calcium: 9.4 mg/dL (ref 8.7–10.2)
Chloride: 105 mmol/L (ref 96–106)
Creatinine, Ser: 0.65 mg/dL — ABNORMAL LOW (ref 0.76–1.27)
Glucose: 120 mg/dL — ABNORMAL HIGH (ref 70–99)
Potassium: 3.8 mmol/L (ref 3.5–5.2)
Sodium: 141 mmol/L (ref 134–144)
eGFR: 137 mL/min/1.73 (ref >59)

## 2024-01-03 NOTE — Progress Notes (Addendum)
 Patient is here today for a BP recheck. BP checked twice and both readings have been documented.  Asked pt if he had taken his BP meds and he said yes that he takes it at night. After BP check, pt was taken to lab. Routing to [place] for review.

## 2024-01-07 ENCOUNTER — Other Ambulatory Visit (HOSPITAL_BASED_OUTPATIENT_CLINIC_OR_DEPARTMENT_OTHER): Payer: Self-pay | Admitting: Family Medicine

## 2024-01-07 ENCOUNTER — Telehealth (HOSPITAL_BASED_OUTPATIENT_CLINIC_OR_DEPARTMENT_OTHER): Payer: Self-pay | Admitting: *Deleted

## 2024-01-07 DIAGNOSIS — K760 Fatty (change of) liver, not elsewhere classified: Secondary | ICD-10-CM

## 2024-01-07 DIAGNOSIS — I10 Essential (primary) hypertension: Secondary | ICD-10-CM

## 2024-01-07 MED ORDER — AMLODIPINE-OLMESARTAN 10-40 MG PO TABS: 1.0000 | ORAL_TABLET | Freq: Every day | ORAL | 5 refills | Status: AC

## 2024-01-07 NOTE — Telephone Encounter (Signed)
-----   Message from Thersia Bitters Caudle sent at 01/07/2024  8:11 AM EDT ----- Regarding: BP Change Please let him know I have sent in the increased dosage of Azor 10-40. Please stop his previous dosage and start this. He is returning in December for labs, please make this a BP check and lab visit. Regarding his labs, he needs to increase clear fluids to support healthy kidney function.

## 2024-01-07 NOTE — Telephone Encounter (Signed)
 Sent pt a message and have updated appt notes to show repeat labwork and BP recheck.

## 2024-02-17 ENCOUNTER — Encounter (HOSPITAL_BASED_OUTPATIENT_CLINIC_OR_DEPARTMENT_OTHER): Payer: Self-pay

## 2024-02-17 ENCOUNTER — Ambulatory Visit (INDEPENDENT_AMBULATORY_CARE_PROVIDER_SITE_OTHER): Admitting: Family Medicine

## 2024-02-17 DIAGNOSIS — K760 Fatty (change of) liver, not elsewhere classified: Secondary | ICD-10-CM | POA: Diagnosis not present

## 2024-02-17 DIAGNOSIS — I10 Essential (primary) hypertension: Secondary | ICD-10-CM

## 2024-02-17 NOTE — Progress Notes (Signed)
 Pt denies CP, SOB, dizziness, or heart palpitations. taking meds as directed without problems. Denies med side effects. 5 min spent with pt.

## 2024-03-09 ENCOUNTER — Other Ambulatory Visit (HOSPITAL_BASED_OUTPATIENT_CLINIC_OR_DEPARTMENT_OTHER): Payer: Self-pay | Admitting: Family Medicine

## 2024-03-09 NOTE — Telephone Encounter (Signed)
 Dose increased and new dose ordered at previous visit
